# Patient Record
Sex: Male | Born: 1940 | Race: White | Hispanic: No | Marital: Married
Health system: Southern US, Community
[De-identification: ages and names within clinical notes are randomized; demographics above are authoritative.]

---

## 1998-07-05 ENCOUNTER — Encounter: Payer: Self-pay | Admitting: Neurosurgery

## 1998-07-05 ENCOUNTER — Ambulatory Visit (HOSPITAL_COMMUNITY): Admission: RE | Admit: 1998-07-05 | Discharge: 1998-07-05 | Payer: Self-pay | Admitting: Neurosurgery

## 1998-07-23 ENCOUNTER — Ambulatory Visit (HOSPITAL_COMMUNITY): Admission: RE | Admit: 1998-07-23 | Discharge: 1998-07-23 | Payer: Self-pay | Admitting: Neurosurgery

## 1998-07-23 ENCOUNTER — Encounter: Payer: Self-pay | Admitting: Neurosurgery

## 2004-03-29 ENCOUNTER — Ambulatory Visit: Payer: Self-pay

## 2004-04-12 ENCOUNTER — Ambulatory Visit: Payer: Self-pay | Admitting: Cardiology

## 2008-09-28 ENCOUNTER — Ambulatory Visit: Payer: Self-pay | Admitting: Internal Medicine

## 2008-09-30 ENCOUNTER — Ambulatory Visit: Payer: Self-pay | Admitting: Internal Medicine

## 2008-12-23 ENCOUNTER — Ambulatory Visit: Payer: Self-pay | Admitting: Unknown Physician Specialty

## 2009-04-14 ENCOUNTER — Ambulatory Visit: Payer: Self-pay | Admitting: Internal Medicine

## 2009-04-24 ENCOUNTER — Inpatient Hospital Stay: Payer: Self-pay | Admitting: Internal Medicine

## 2011-10-03 ENCOUNTER — Ambulatory Visit: Payer: Self-pay | Admitting: Internal Medicine

## 2011-10-10 ENCOUNTER — Inpatient Hospital Stay: Payer: Self-pay | Admitting: Internal Medicine

## 2011-10-10 LAB — COMPREHENSIVE METABOLIC PANEL
Albumin: 3.4 g/dL (ref 3.4–5.0)
Alkaline Phosphatase: 90 U/L (ref 50–136)
BUN: 13 mg/dL (ref 7–18)
Bilirubin,Total: 0.7 mg/dL (ref 0.2–1.0)
Calcium, Total: 8.7 mg/dL (ref 8.5–10.1)
Chloride: 93 mmol/L — ABNORMAL LOW (ref 98–107)
Co2: >45 mmol/L (ref 21–32)
Creatinine: 0.67 mg/dL (ref 0.60–1.30)
EGFR (African American): >60
EGFR (Non-African Amer.): >60
Glucose: 87 mg/dL (ref 65–99)
Osmolality: 281 (ref 275–301)
Potassium: 5.1 mmol/L (ref 3.5–5.1)
SGOT(AST): 20 U/L (ref 15–37)
SGPT (ALT): 15 U/L
Sodium: 141 mmol/L (ref 136–145)
Total Protein: 6.6 g/dL (ref 6.4–8.2)

## 2011-10-10 LAB — URINALYSIS, COMPLETE
Bacteria: NONE SEEN
Bilirubin,UR: NEGATIVE
Blood: NEGATIVE
Glucose,UR: NEGATIVE mg/dL (ref 0–75)
Ketone: NEGATIVE
Leukocyte Esterase: NEGATIVE
Nitrite: NEGATIVE
Ph: 8 (ref 4.5–8.0)
Protein: NEGATIVE
RBC,UR: 5 /HPF (ref 0–5)
Specific Gravity: 1.013 (ref 1.003–1.030)
Squamous Epithelial: <1
WBC UR: 1 /HPF (ref 0–5)

## 2011-10-10 LAB — CBC
HCT: 44.9 % (ref 40.0–52.0)
HGB: 14.2 g/dL (ref 13.0–18.0)
MCH: 31 pg (ref 26.0–34.0)
MCHC: 31.5 g/dL — ABNORMAL LOW (ref 32.0–36.0)
MCV: 98 fL (ref 80–100)
Platelet: 168 10*3/uL (ref 150–440)
RBC: 4.57 10*6/uL (ref 4.40–5.90)
RDW: 14.8 % — ABNORMAL HIGH (ref 11.5–14.5)
WBC: 9.2 10*3/uL (ref 3.8–10.6)

## 2011-10-10 LAB — TROPONIN I: Troponin-I: <0.02 ng/mL

## 2011-10-11 LAB — TSH: Thyroid Stimulating Horm: 1.51 u[IU]/mL

## 2011-10-24 ENCOUNTER — Ambulatory Visit: Payer: Self-pay | Admitting: Specialist

## 2013-07-27 DEATH — deceased

## 2013-12-18 IMAGING — CT CT HEAD WITHOUT CONTRAST
3 of 4 series · 17 of 30 positions shown, 19 images · non-contrast
Comparison: none

REASON FOR EXAM: [id number] PAGER altered mental status left sided weakness
COMMENTS:

PROCEDURE:     CT  - CT HEAD WITHOUT CONTRAST  - October 03, 2011 [DATE]
RESULT:     Head CT dated 10/03/2011.
TECHNIQUE: Helical 5 mm noncontrast sections are obtained from the skull
base through the vertex.

[Series 2: without · axial · non-contrast · 0.41mm/px · z∈[-206,-106]mm · 5 of 32 slices shown]
[im 6/32  brain]
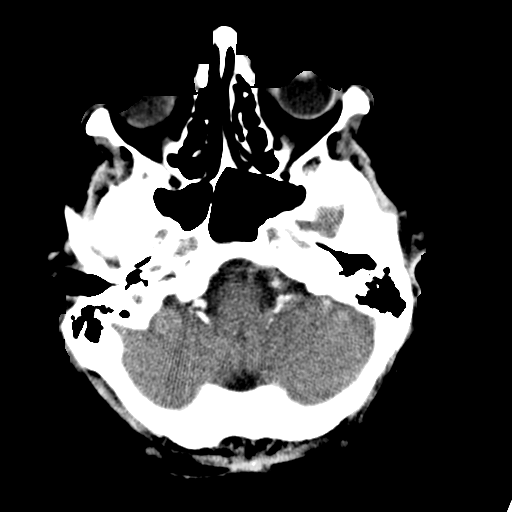
[im 11/32  brain]
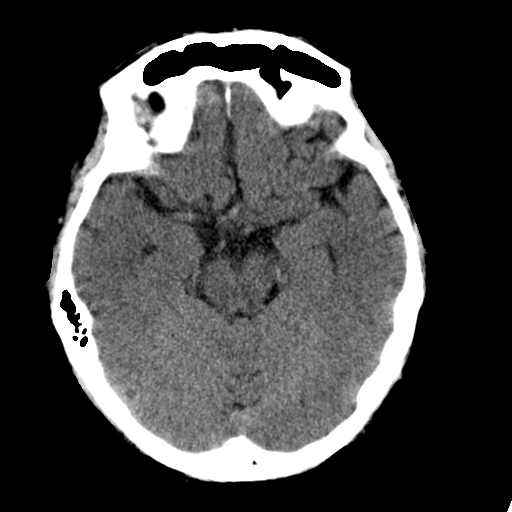
[im 16/32  brain]
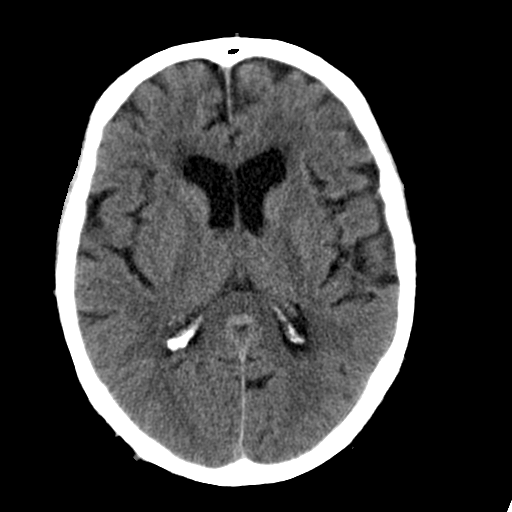
[im 21/32  brain]
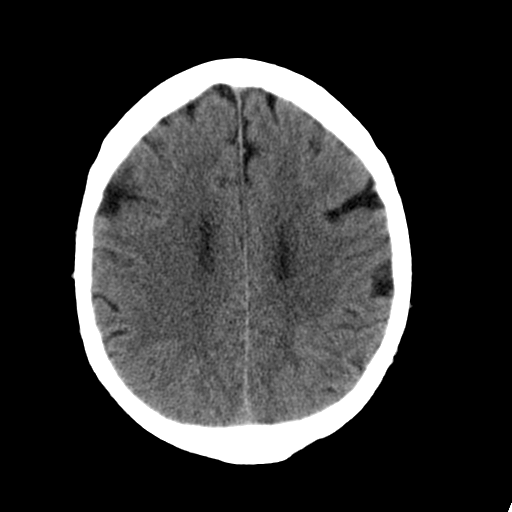
[im 26/32  brain]
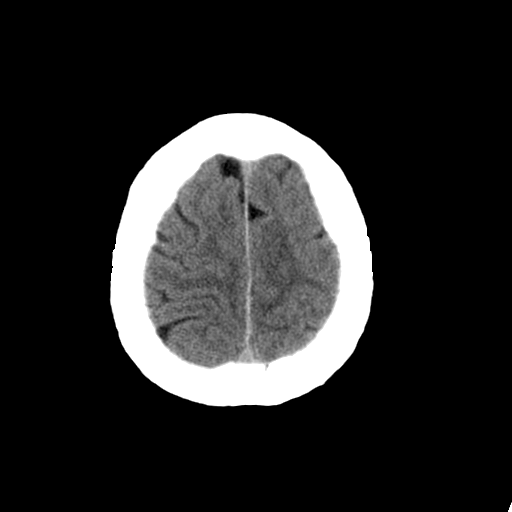

[Series 6: without recon · axial · non-contrast · 0.41mm/px · z∈[-203,-89]mm · 6 of 33 slices shown, 8 images]
[im 5/33  brain]
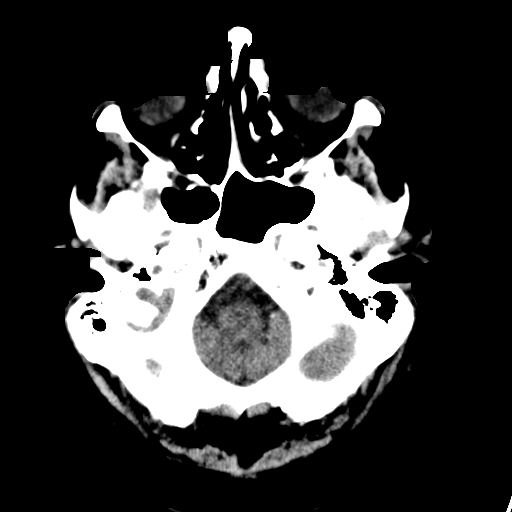
[im 5/33  bone]
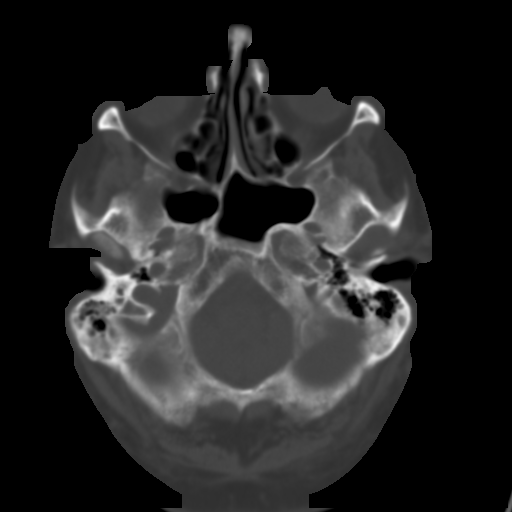
[im 10/33  brain]
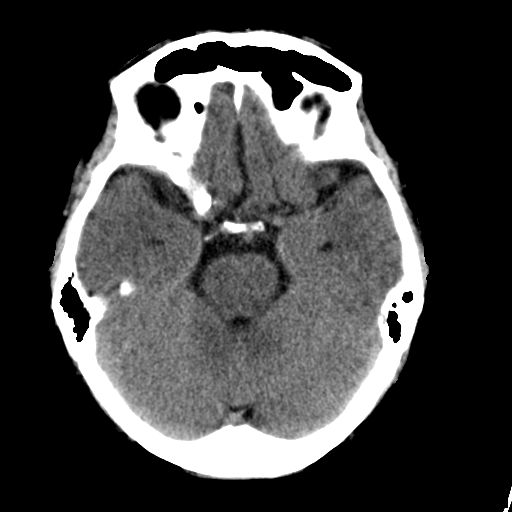
[im 14/33  brain]
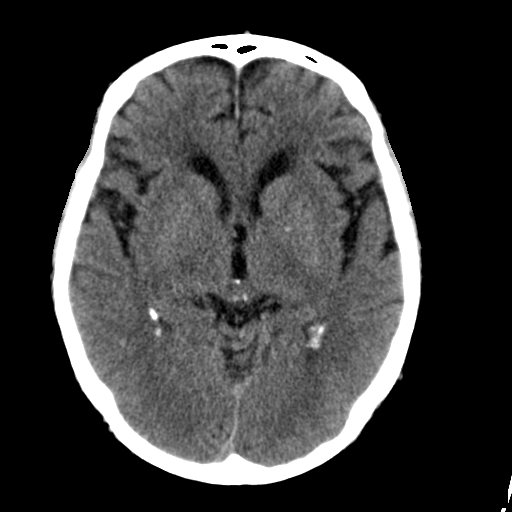
[im 19/33  brain]
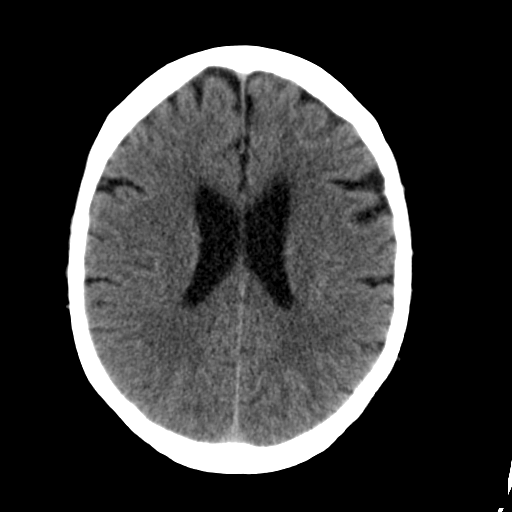
[im 23/33  brain]
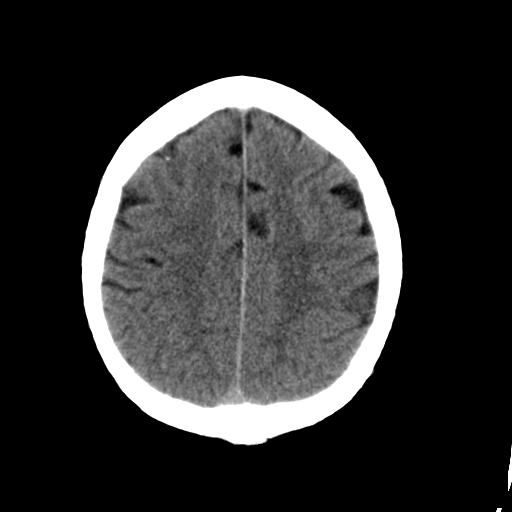
[im 23/33  bone]
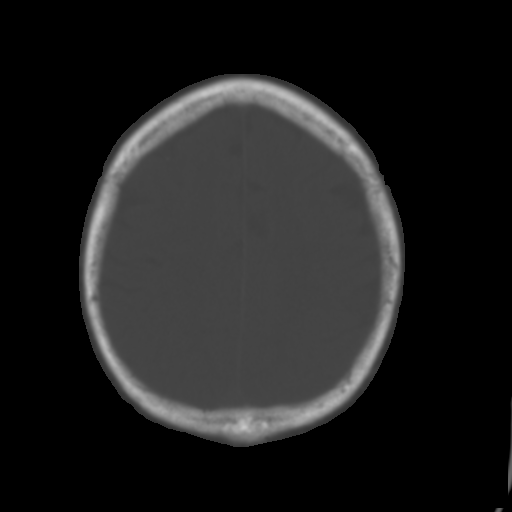
[im 28/33  brain]
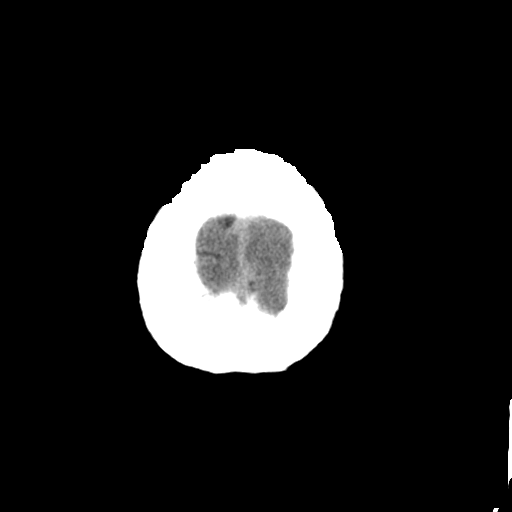

[Series 7: bone (id) · axial · 0.41mm/px · z∈[-201,-87]mm · 6 of 33 slices shown]
[im 5/33  bone]
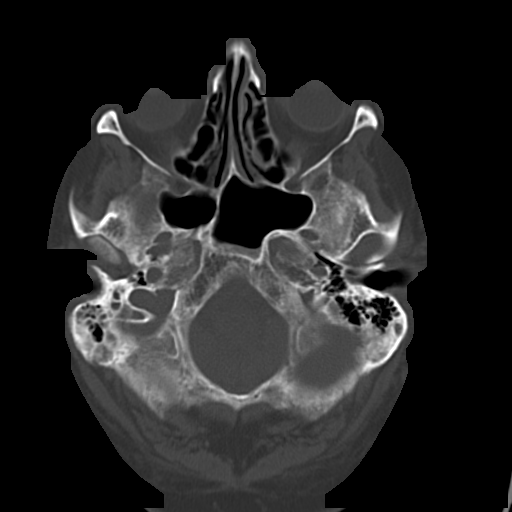
[im 10/33  bone]
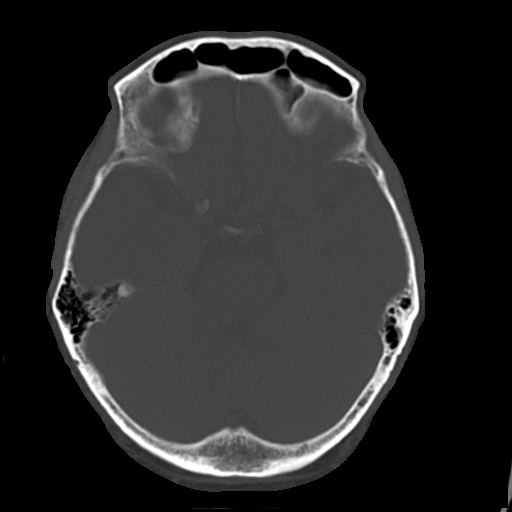
[im 14/33  bone]
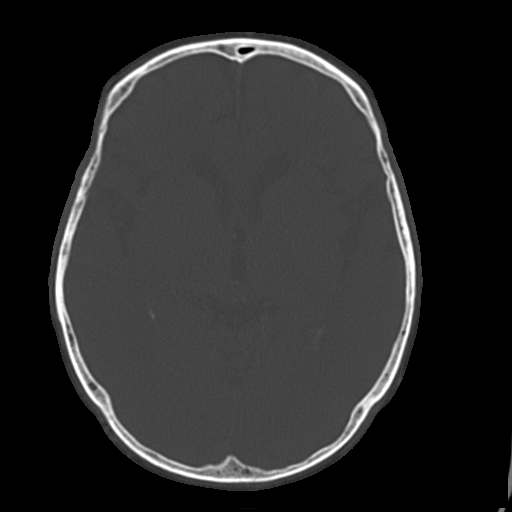
[im 19/33  bone]
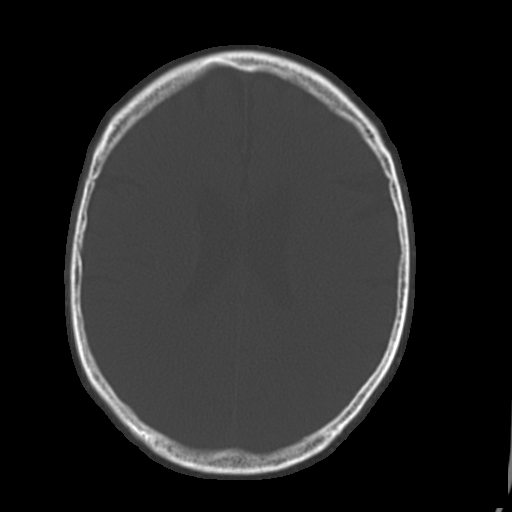
[im 23/33  bone]
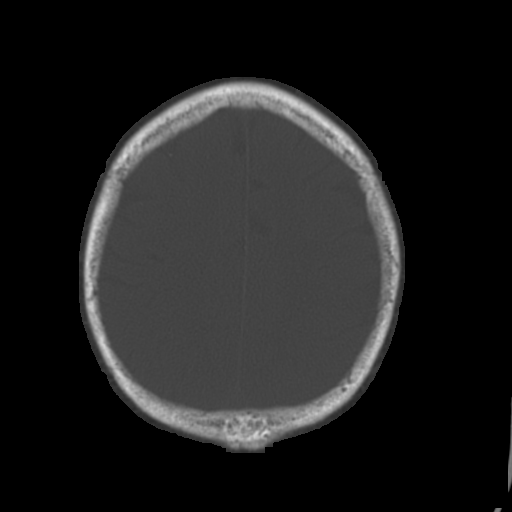
[im 28/33  bone]
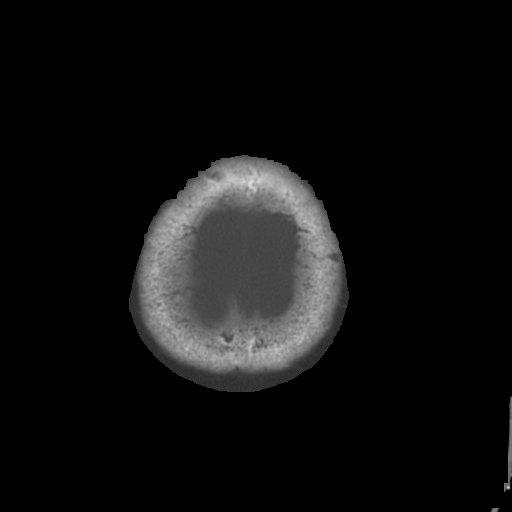

[17 of 30 positions shown; findings below may reference images not displayed]

Vinings: There is no evidence of intra-axial nor extra-axial fluid
collections no evidence of acute hemorrhage. Areas of low-attenuation, mild
project within the subcortical, deep and periventricular white matter
regions. Ventricles and cisterns are patent. There is no evidence of mass
effect. Is no evidence of a depressed skull fracture. Paranasal sinuses and
mastoid air cells are patent.
IMPRESSION: Mild involutional changes without evidence of acute
abnormalities.

## 2014-10-18 NOTE — H&P (Signed)
PATIENT NAME:  Victor Williamson, Victor Williamson MR#:  [id number] DATE OF BIRTH:  29-Oct-1940  DATE OF ADMISSION:  10/10/2011  HISTORY OF PRESENT ILLNESS:  The patient is a 74 year old male presenting with respiratory failure and encephalopathy. His wife has noted confusion over the last couple of weeks. He was seen in the office with normal lab work, normal brain CT, normal chest x-ray. He was given steroids and antibiotics. He seemed to improve and then early this week started having more confusion and went to the Emergency Department where lab work was normal, but pCO2 was 106 with pH 7.29. BiPAP was placed. He will be admitted for evaluation and treatment of hypercarbic respiratory failure.   PAST MEDICAL HISTORY:  1. Endstage emphysema.  2. Esophageal stricture.  PAST SURGICAL HISTORY:    1. Nissen fundoplication.  2. Lumbar laminectomy.   ALLERGIES: None.   MEDICATIONS:  1. Vitamin D 2000 units daily.  2. Spiriva daily.  3. Prilosec 20 mg daily. 4. Dulera HFA 100/5, 2 b.i.d.  5. Xanax 0.25 mg b.i.d.   SOCIAL HISTORY: He is married. No smoking. No alcohol.   FAMILY HISTORY: Noncontributory.   REVIEW OF SYSTEMS: As above, otherwise negative. All systems reviewed.   PHYSICAL EXAMINATION:  VITAL SIGNS: Blood pressure 125/80, pulse 80.   HEENT: Normal TMs. Normal oropharynx.   NECK: No JVD.   LUNGS: Mild expiratory wheezing diffusely.   HEART: Regular rhythm. Inferiorly displaced PMI.   ABDOMEN: Soft and nontender.   EXTREMITIES: No edema.   NEUROLOGICAL: The patient is slightly confused. No focality.   LABS: Troponin normal. CBC, MET-C normal. pH 7.29, pCO2 106, pO2 61.   ASSESSMENT AND PLAN:  1. Hypercarbic respiratory failure- BiPAP. Dr. Raul Del to see tonight. Steroids, nebulizers. He has been wheezing more lately. Minimize Xanax.  2. Gastroesophageal reflux disease- Prilosec.  3. Overall prognosis is guarded.   ____________________________ Rusty Aus,  MD mfm:bjt D: 10/10/2011 17:27:02 ET T: 10/10/2011 17:57:39 ET JOB#: [id number]  cc: Rusty Aus, MD, <Dictator> Rusty Aus MD ELECTRONICALLY SIGNED 10/13/2011 7:48

## 2014-10-18 NOTE — Discharge Summary (Signed)
PATIENT NAME:  Victor BarrFULLER, Demontrae N MR#:  [id number] DATE OF BIRTH:  1940/10/21  DATE OF ADMISSION:  10/10/2011 DATE OF DISCHARGE:  10/16/2011  DISCHARGE DIAGNOSES:  1. Hypercarbic respiratory failure.  2. End-stage emphysema.  3. Esophageal stricture.   DISCHARGE MEDICATIONS:  1. Spiriva daily.  2. Aspirin 81 mg daily.  3. Prilosec 20 mg daily.  4. Xanax 0.25 mg b.i.d. p.r.n.  5. Vitamin D2 2000 units daily.  6. Symbicort 160/4.5, two puffs b.i.d.  7. Prednisone taper.  8. Singulair 10 mg at bedtime.   REASON FOR ADMISSION: 74 year old male presents with encephalopathy and hypercarbia. Please see history and physical for history of present illness, past medical history, and physical exam.   HOSPITAL COURSE: The patient was admitted, placed on BiPAP. Initial pCO2 was 106. It went down to 65 with BiPAP. Ultimately, was able to wean be weaned off the BiPAP, maintaining again PCO2s of 60-70. He was given IV Solu-Medrol. Ultimately, was switched over to p.o. prednisone. He improved dramatically. Singulair was added. He was up walking with one liter with mild dyspnea on exertion. His encephalopathy and altered mental status resolved with resolution of the hypercarbia. Overall prognosis is very guarded with his severe emphysema.      FOLLOWUP: He will follow up with Dr. Hyacinth MeekerMiller in one week. Follow up with Dr. Meredeth IdeFleming in regard to his need for BiPAP at home and whether or not he has sleep apnea and thus will need a sleep test.    ____________________________ Danella PentonMark F. Senan Urey, MD mfm:ap D: 10/16/2011 07:53:22 ET T: 10/16/2011 11:09:13 ET JOB#: [id number]  cc: Danella PentonMark F. Stephan Nelis, MD, <Dictator> Sukhman Kocher Sherlene ShamsF Ronell Duffus MD ELECTRONICALLY SIGNED 10/17/2011 8:06
